# Patient Record
Sex: Male | Born: 1990 | Race: White | Hispanic: No | Marital: Single | Smoking: Former smoker
Health system: Southern US, Community
[De-identification: ages and names within clinical notes are randomized; demographics above are authoritative.]

## PROBLEM LIST (undated history)

## (undated) DIAGNOSIS — F329 Major depressive disorder, single episode, unspecified: Secondary | ICD-10-CM

## (undated) DIAGNOSIS — F32A Depression, unspecified: Secondary | ICD-10-CM

## (undated) DIAGNOSIS — T7840XA Allergy, unspecified, initial encounter: Secondary | ICD-10-CM

## (undated) HISTORY — DX: Depression, unspecified: F32.A

## (undated) HISTORY — DX: Major depressive disorder, single episode, unspecified: F32.9

## (undated) HISTORY — DX: Allergy, unspecified, initial encounter: T78.40XA

## (undated) HISTORY — PX: FRACTURE SURGERY: SHX138

---

## 2014-02-10 ENCOUNTER — Emergency Department (HOSPITAL_COMMUNITY): Payer: BC Managed Care – PPO

## 2014-02-10 ENCOUNTER — Emergency Department (HOSPITAL_COMMUNITY)
Admission: EM | Admit: 2014-02-10 | Discharge: 2014-02-10 | Disposition: A | Discharge status: Home / Self Care | Payer: BC Managed Care – PPO | Attending: Emergency Medicine | Admitting: Emergency Medicine

## 2014-02-10 ENCOUNTER — Encounter (HOSPITAL_COMMUNITY): Payer: Self-pay | Admitting: Emergency Medicine

## 2014-02-10 DIAGNOSIS — Y929 Unspecified place or not applicable: Secondary | ICD-10-CM | POA: Insufficient documentation

## 2014-02-10 DIAGNOSIS — F17200 Nicotine dependence, unspecified, uncomplicated: Secondary | ICD-10-CM | POA: Insufficient documentation

## 2014-02-10 DIAGNOSIS — Y9389 Activity, other specified: Secondary | ICD-10-CM | POA: Insufficient documentation

## 2014-02-10 DIAGNOSIS — S62308A Unspecified fracture of other metacarpal bone, initial encounter for closed fracture: Secondary | ICD-10-CM

## 2014-02-10 DIAGNOSIS — Z79899 Other long term (current) drug therapy: Secondary | ICD-10-CM | POA: Insufficient documentation

## 2014-02-10 DIAGNOSIS — R609 Edema, unspecified: Secondary | ICD-10-CM | POA: Insufficient documentation

## 2014-02-10 DIAGNOSIS — W2209XA Striking against other stationary object, initial encounter: Secondary | ICD-10-CM | POA: Insufficient documentation

## 2014-02-10 DIAGNOSIS — S62309A Unspecified fracture of unspecified metacarpal bone, initial encounter for closed fracture: Secondary | ICD-10-CM | POA: Insufficient documentation

## 2014-02-10 MED ORDER — OXYCODONE-ACETAMINOPHEN 5-325 MG PO TABS
2.0000 | ORAL_TABLET | Freq: Once | ORAL | Status: AC
  Administered 2014-02-10: 2 via ORAL
  Filled 2014-02-10: qty 2

## 2014-02-10 MED ORDER — TETANUS-DIPHTH-ACELL PERTUSSIS 5-2.5-18.5 LF-MCG/0.5 IM SUSP
0.5000 mL | Freq: Once | INTRAMUSCULAR | Status: AC
  Administered 2014-02-10: 0.5 mL via INTRAMUSCULAR
  Filled 2014-02-10: qty 0.5

## 2014-02-10 MED ORDER — OXYCODONE-ACETAMINOPHEN 5-325 MG PO TABS: 1.0000 | ORAL_TABLET | ORAL | Status: DC | PRN

## 2014-02-10 NOTE — ED Provider Notes (Signed)
CSN: [id number]     Arrival date & time 02/10/14  0004 History   First MD Initiated Contact with Patient 02/10/14 0127     Chief Complaint  Patient presents with  . Hand Injury     (Consider location/radiation/quality/duration/timing/severity/associated sxs/prior Treatment) The history is provided by the patient and medical records. No language interpreter was used.    Calvin MascotRonald Nelson is a 23 y.o. male  with a hx of depression, ADHD presents to the Emergency Department complaining of acute, persistent, progressively worsening right hand pain onset 11:45PM after punching a wall.  Associated symptoms include swelling and pain to the dorsum of the right hand.  Nothing makes it better and movement and palpation makes it worse.  Pt denies fever, chills, neck pain, shoulder pain, elbow pain.  Pt endorses mild right wrist pain as well.  He reports large amounts of EtOH prior to punching the drywall.  Marland Kitchen.     History reviewed. No pertinent past medical history. History reviewed. No pertinent past surgical history. No family history on file. History  Substance Use Topics  . Smoking status: Current Every Day Smoker  . Smokeless tobacco: Current User  . Alcohol Use: Yes    Review of Systems  Constitutional: Negative for fever and chills.  Gastrointestinal: Negative for nausea and vomiting.  Musculoskeletal: Positive for arthralgias and joint swelling. Negative for back pain, neck pain and neck stiffness.  Skin: Negative for wound.  Neurological: Negative for numbness.  Hematological: Does not bruise/bleed easily.  Psychiatric/Behavioral: The patient is not nervous/anxious.   All other systems reviewed and are negative.      Allergies  Other  Home Medications   Current Outpatient Rx  Name  Route  Sig  Dispense  Refill  . amphetamine-dextroamphetamine (ADDERALL) 20 MG tablet   Oral   Take 20 mg by mouth daily.         Marland Kitchen. oxyCODONE-acetaminophen (PERCOCET/ROXICET) 5-325 MG per  tablet   Oral   Take 1-2 tablets by mouth every 4 (four) hours as needed for severe pain.   20 tablet   0   . sertraline (ZOLOFT) 50 MG tablet   Oral   Take 50 mg by mouth daily.          BP 160/99  Pulse 98  Temp(Src) 97.8 F (36.6 C) (Oral)  Resp 18  Ht 6' (1.829 m)  Wt 230 lb (104.327 kg)  BMI 31.19 kg/m2  SpO2 96% Physical Exam  Nursing note and vitals reviewed. Constitutional: He appears well-developed and well-nourished. No distress.  HENT:  Head: Normocephalic and atraumatic.  Eyes: Conjunctivae are normal.  Neck: Normal range of motion.  Cardiovascular: Normal rate, regular rhythm, normal heart sounds and intact distal pulses.   No murmur heard. Capillary refill less than 3 seconds  Pulmonary/Chest: Effort normal and breath sounds normal.  Musculoskeletal: He exhibits tenderness. He exhibits no edema.  ROM: Range of motion in the right pinky and right wrist secondary to pain Large hematoma overlying the dorsum of the right hand pain to palpation in this area No visible or palpable deformity of the right wrist  Neurological: He is alert. Coordination normal.  Sensation intact to dull and sharp including 2. discrimination Strength 5 out of 5 resisted flexion and extension of the first 4 digits of the right hand, 3/5 resisted flexion and extension of the right pinky and wrist secondary to pain; 5 out of 5 strength in all other joints of the right upper extremity  Skin:  Skin is warm and dry. He is not diaphoretic.  No tenting of the skin  Psychiatric: He has a normal mood and affect.    ED Course  Reduction of fracture Date/Time: 02/10/2014 2:47 AM Performed by: Dierdre Forth Authorized by: Dierdre Forth Consent: Verbal consent obtained. Risks and benefits: risks, benefits and alternatives were discussed Consent given by: patient Patient understanding: patient states understanding of the procedure being performed Patient consent: the patient's  understanding of the procedure matches consent given Procedure consent: procedure consent matches procedure scheduled Relevant documents: relevant documents present and verified Site marked: the operative site was marked Imaging studies: imaging studies available Required items: required blood products, implants, devices, and special equipment available Patient identity confirmed: verbally with patient and arm band Time out: Immediately prior to procedure a "time out" was called to verify the correct patient, procedure, equipment, support staff and site/side marked as required. Preparation: Patient was prepped and draped in the usual sterile fashion. Local anesthesia used: yes Anesthesia: hematoma block Local anesthetic: lidocaine 2% with epinephrine Anesthetic total: 10 ml Patient sedated: no Patient tolerance: Patient tolerated the procedure well with no immediate complications.   (including critical care time) Labs Review Labs Reviewed - No data to display Imaging Review Dg Hand 2 View Right  02/10/2014   CLINICAL DATA:  Postreduction fourth and fifth metacarpals.  EXAM: RIGHT HAND - 2 VIEW  COMPARISON:  Radiograph from the same day at 1:07 a.m.  FINDINGS: There has been splinting of the hand. There is persistent 100% posterior displacement of transverse fractures through the fourth and fifth metacarpal shafts. No interval joint malalignment. Distal forearm alignment stable from prior.  IMPRESSION: Fourth and fifth metacarpal shaft fractures remain posteriorly displaced.   Electronically Signed   By: Tiburcio Pea M.D.   On: 02/10/2014 03:51   Dg Hand Complete Right  02/10/2014   CLINICAL DATA:  Hand injury.  Punched wall.  EXAM: RIGHT HAND - COMPLETE 3+ VIEW  COMPARISON:  None.  FINDINGS: Transverse fractures through the fourth and fifth metacarpal shafts. Both are posteriorly displaced. The fourth metacarpal fracture is the most significantly displaced, over 100%. There is angulation of  the distal ulna ventrally. Small ossicle at the ulnar styloid process may represent remote injury.  IMPRESSION: 1. Displaced fourth and fifth metacarpal shaft fractures. 2. Angulation of the distal ulnar shaft, correlate forearm trauma.   Electronically Signed   By: Tiburcio Pea M.D.   On: 02/10/2014 02:46     EKG Interpretation None      MDM   Final diagnoses:  Closed fracture of 4th metacarpal  Closed fracture of 5th metacarpal    Calvin Nelson presents after punching a wall with right hand pain.  He has limited ROM of the wrist 2/2 pain, but reports that he was moving it freely before the swelling became severe.   X-ray with displaced fracture of the right 4th and 5th metacarpals.  Hematoma block given and fracture reduced.  Will obtain  postreduction films and continued pain control.     4:00 AM Pt splinted and post reduction films with minimal improvement.  Pt with adequate pain control.  Will d/c home for close follow-up with hand surgery.  Pt reports he may do this when he returns home this weekend.    Pt advised to follow up with orthopedics for further evaluation and treatment.  Patient given ulnar gutter while in ED, conservative therapy recommended and discussed. Patient will be dc home & is agreeable with  above plan. I have also discussed reasons to return immediately to the ER.  Patient expresses understanding and agrees with plan.  It has been determined that no acute conditions requiring further emergency intervention are present at this time. The patient/guardian have been advised of the diagnosis and plan. We have discussed signs and symptoms that warrant return to the ED, such as changes or worsening in symptoms.   Vital signs are stable at discharge.   BP 160/99  Pulse 98  Temp(Src) 97.8 F (36.6 C) (Oral)  Resp 18  Ht 6' (1.829 m)  Wt 230 lb (104.327 kg)  BMI 31.19 kg/m2  SpO2 96%  Patient/guardian has voiced understanding and agreed to follow-up with the  PCP or specialist.        Dierdre Forth, PA-C 02/10/14 0448

## 2014-02-10 NOTE — ED Provider Notes (Signed)
Medical screening examination/treatment/procedure(s) were performed by non-physician practitioner and as supervising physician I was immediately available for consultation/collaboration.    Daryle Amis M Tavoris Brisk, MD 02/10/14 0629 

## 2014-02-10 NOTE — ED Notes (Signed)
Pt states he punched a wall and now having R hand pain

## 2014-02-10 NOTE — Progress Notes (Signed)
Orthopedic Tech Progress Note Patient Details:  Calvin MascotRonald Nelson Jun 13, 1991 [id number]  Ortho Devices Type of Ortho Device: Ulna gutter splint   Haskell Flirtewsome, Aggie Douse M 02/10/2014, 2:08 AM

## 2014-02-10 NOTE — Discharge Instructions (Signed)
1. Medications: percocet, usual home medications 2. Treatment: rest, drink plenty of fluids, keep splint dry 3. Follow Up: Please followup with hand surgery   Boxer's Fracture You have a break (fracture) of the fifth metacarpal bone. This is commonly called a boxer's fracture. This is the bone in the hand where the little finger attaches. The fracture is in the end of that bone, closest to the little finger. It is usually caused when you hit an object with a clenched fist. Often, the knuckle is pushed down by the impact. Sometimes, the fracture rotates out of position. A boxer's fracture will usually heal within 6 weeks, if it is treated properly and protected from re-injury. Surgery is sometimes needed. A cast, splint, or bulky hand dressing may be used to protect and immobilize a boxer's fracture. Do not remove this device or dressing until your caregiver approves. Keep your hand elevated, and apply ice packs for 15-20 minutes every 2 hours, for the first 2 days. Elevation and ice help reduce swelling and relieve pain. See your caregiver, or an orthopedic specialist, for follow-up care within the next 10 days. This is to make sure your fracture is healing properly. Document Released: 11/24/2005 Document Revised: 02/16/2012 Document Reviewed: 05/14/2007 [facility] Patient Information 2014 [place]Care, [place].

## 2015-10-10 IMAGING — CR DG HAND 2V*R*
2 series · 2 of 2 positions shown · non-contrast
Comparison: Radiograph from the same day at [DATE] a.m.

CLINICAL DATA: Postreduction fourth and fifth metacarpals.

EXAM:
RIGHT HAND - 2 VIEW

[x hand pa right]
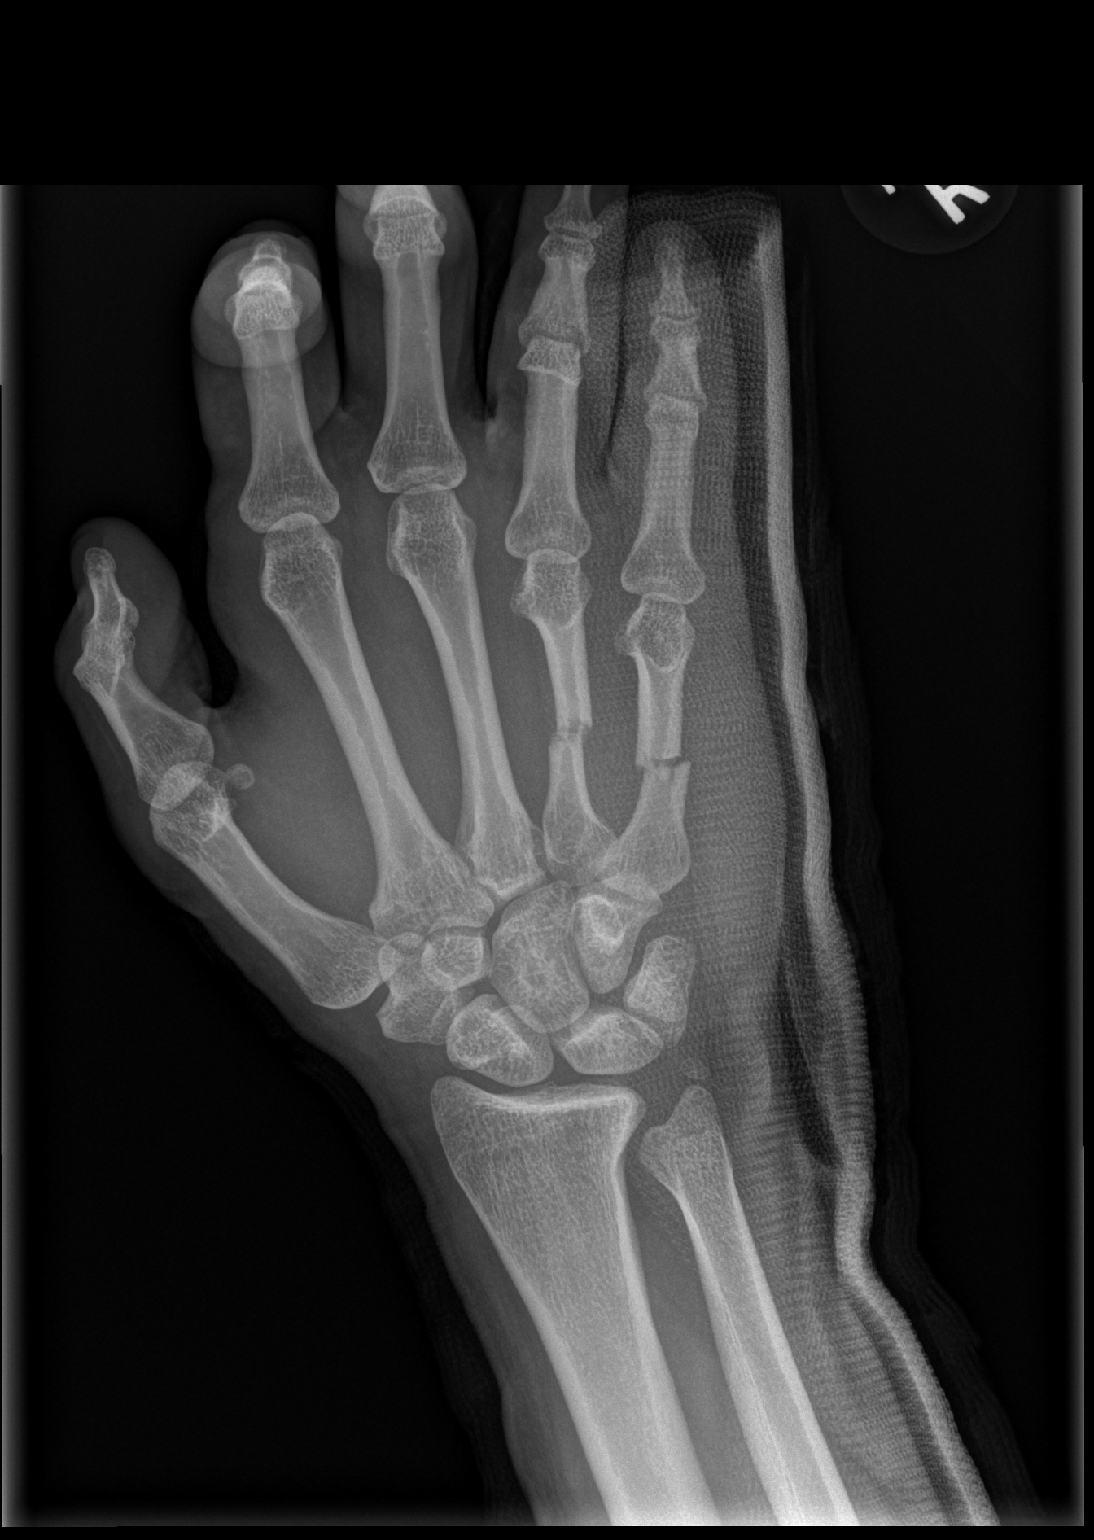

[x hand lat right]
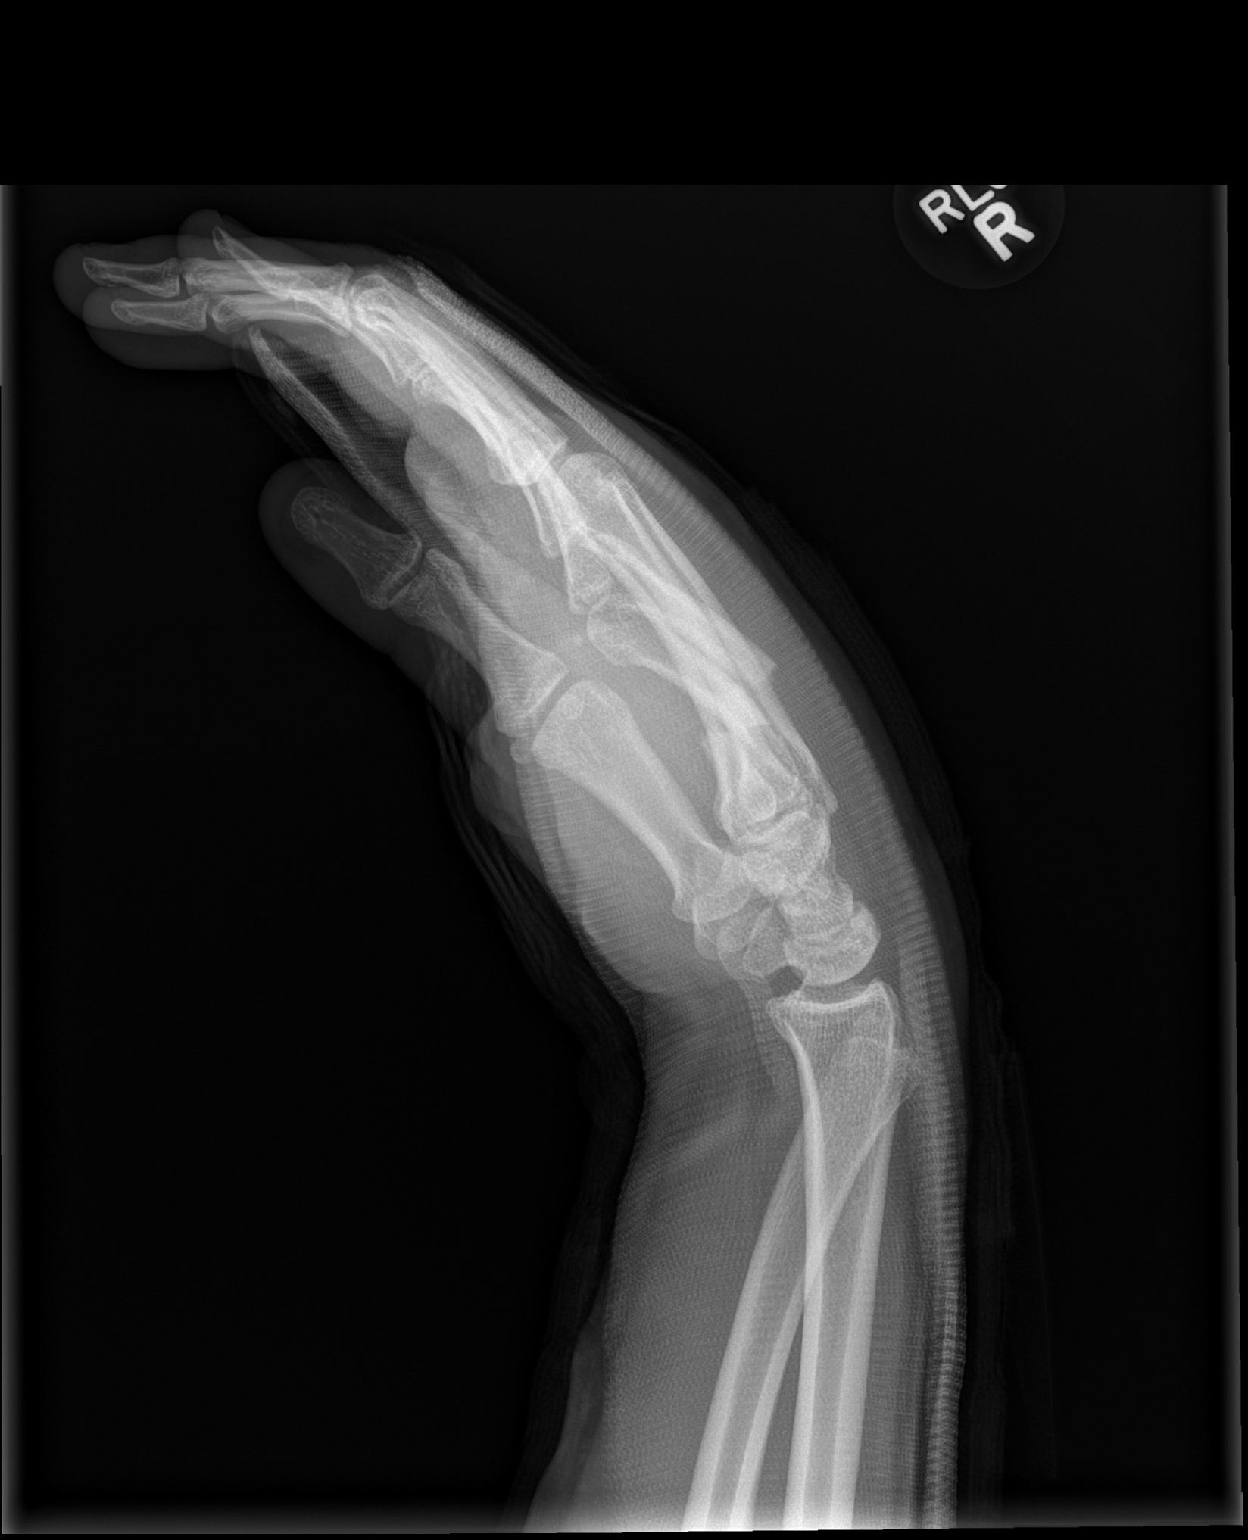

[2 of 2 positions shown; findings below may reference images not displayed]

FINDINGS: There has been splinting of the hand. There is persistent 100%
posterior displacement of transverse fractures through the fourth
and fifth metacarpal shafts. No interval joint malalignment. Distal
forearm alignment stable from prior.
IMPRESSION: Fourth and fifth metacarpal shaft fractures remain posteriorly
displaced.

## 2016-09-20 ENCOUNTER — Ambulatory Visit (INDEPENDENT_AMBULATORY_CARE_PROVIDER_SITE_OTHER): Payer: BLUE CROSS/BLUE SHIELD | Admitting: Family Medicine

## 2016-09-20 VITALS — BP 146/100 | HR 77 | Temp 98.2°F | Resp 17 | Ht 72.0 in | Wt 304.0 lb

## 2016-09-20 DIAGNOSIS — S00412A Abrasion of left ear, initial encounter: Secondary | ICD-10-CM

## 2016-09-20 DIAGNOSIS — Z7289 Other problems related to lifestyle: Secondary | ICD-10-CM

## 2016-09-20 DIAGNOSIS — H6692 Otitis media, unspecified, left ear: Secondary | ICD-10-CM | POA: Diagnosis not present

## 2016-09-20 DIAGNOSIS — Z789 Other specified health status: Secondary | ICD-10-CM | POA: Diagnosis not present

## 2016-09-20 DIAGNOSIS — R030 Elevated blood-pressure reading, without diagnosis of hypertension: Secondary | ICD-10-CM

## 2016-09-20 DIAGNOSIS — H7292 Unspecified perforation of tympanic membrane, left ear: Secondary | ICD-10-CM

## 2016-09-20 MED ORDER — OFLOXACIN 0.3 % OT SOLN: 10.0000 [drp] | Freq: Every day | OTIC | 0 refills | Status: AC

## 2016-09-20 MED ORDER — AMOXICILLIN 875 MG PO TABS: 875.0000 mg | ORAL_TABLET | Freq: Two times a day (BID) | ORAL | 0 refills | Status: AC

## 2016-09-20 NOTE — Progress Notes (Signed)
By signing my name below I, Shelah LewandowskyJoseph Thomas, attest that this documentation has been prepared under the direction and in the presence of Shade FloodJeffrey R Levenia Skalicky, MD. Electonically Signed. Shelah LewandowskyJoseph Thomas, Scribe 09/20/2016 at 11:57 AM  Subjective:    Patient ID: Calvin Nelson, male    DOB: 01/12/1991, 25 y.o.   MRN: [id number]  Chief Complaint  Patient presents with  . Ear Pain    And bleeding. Left. Started this AM.    HPI Calvin Nelson is a 25 y.o. male who presents to the [facility] complaining of left ear pain and bleeding that started this morning. Pt has had a malodorous discharge from left ear for the past several months. Discharge has been mostly brownish yellow clumps. Pt uses Q-tips everyday. Pt also reports reduced hearing in her left ear for the past several months. Pt had ear tubes placed multiple times in his past. Last time tubes were placed was while pt was in high school.   Pt c/o mild dry cough for "awhile".   Pt also c/o having intermittent generalized HAs for the past month as well.   Pt denies any fever, unexplained thirst, diaphoresis, or night sweats.   Pt admits to drinking alcohol daily. Pt drinks 4 beers a day on average.  There are no active problems to display for this patient.  Past Medical History:  Diagnosis Date  . Allergy   . Depression    Past Surgical History:  Procedure Laterality Date  . FRACTURE SURGERY     Hand   Allergies  Allergen Reactions  . Other Other (See Comments)    Raw fruit & vegetables : can eat them if they are cooked Pollen: seasonal allergies   Prior to Admission medications   Medication Sig Start Date End Date Taking? Authorizing Provider  amphetamine-dextroamphetamine (ADDERALL) 20 MG tablet Take 20 mg by mouth daily.   Yes Historical Provider, MD  sertraline (ZOLOFT) 50 MG tablet Take 50 mg by mouth daily.   Yes Historical Provider, MD  oxyCODONE-acetaminophen (PERCOCET/ROXICET) 5-325 MG per tablet Take  1-2 tablets by mouth every 4 (four) hours as needed for severe pain. Patient not taking: Reported on 09/20/2016 02/10/14   Dierdre ForthHannah Muthersbaugh, PA-C   Social History   Social History  . Marital status: Single    Spouse name: N/A  . Number of children: N/A  . Years of education: N/A   Occupational History  . Not on file.   Social History Main Topics  . Smoking status: Former Games developermoker  . Smokeless tobacco: Current User  . Alcohol use Yes  . Drug use: No  . Sexual activity: Not on file   Other Topics Concern  . Not on file   Social History Narrative  . No narrative on file      Review of Systems  Constitutional: Negative for diaphoresis and fever.  HENT: Positive for ear discharge (left) and ear pain (left).   Endocrine: Negative for polydipsia.  Neurological: Positive for headaches.       Objective:   Physical Exam  Constitutional: He is oriented to person, place, and time. He appears well-developed and well-nourished. No distress.  HENT:  Head: Normocephalic and atraumatic.  Rt ear canal has irritation along canal and scarring on the TM. No rt canal edema.  Left mastoid is nontender. There is dried blood along the left ear canal with abrasions and irritation. Unable to visualize left TM due to yellow exudate mixed with blood.  Eyes:  Conjunctivae and EOM are normal. Pupils are equal, round, and reactive to light.  Neck: Neck supple.  Cardiovascular: Normal rate, regular rhythm and normal heart sounds.  Exam reveals no gallop and no friction rub.   No murmur heard. Pulmonary/Chest: Effort normal and breath sounds normal. No accessory muscle usage. He has no decreased breath sounds. He has no wheezes. He has no rhonchi. He has no rales.  Musculoskeletal: Normal range of motion.  No lower extremity edema.   Neurological: He is alert and oriented to person, place, and time.  Nonfocal neuro exam.   Skin: Skin is warm and dry.  Psychiatric: He has a normal mood and affect.  His behavior is normal.  Nursing note and vitals reviewed.   Vitals:   09/20/16 1133  BP: (!) 140/108  Pulse: 77  Resp: 17  Temp: 98.2 F (36.8 C)  TempSrc: Oral  SpO2: 96%  Weight: (!) 304 lb (137.9 kg)  Height: 6' (1.829 m)         Assessment & Plan:    Calvin Nelson is a 25 y.o. male Left otitis media with spontaneous rupture of eardrum - Plan: amoxicillin (AMOXIL) 875 MG tablet, ofloxacin (FLOXIN OTIC) 0.3 % otic solution Abrasion of left ear canal, initial encounter - Plan: ofloxacin (FLOXIN OTIC) 0.3 % otic solution  -With intermittent discharge and previous muffled hearing, possible otitis media with rupture and small amount of blood due to rupture versus abrasion of ear canal with the use of cotton tipped swabs. Did not see an appreciable amount of canal edema, but otitis externa also possible.   -Start Floxin Otic, amoxicillin to cover for both otitis media with rupture and possible early externa.   -Recheck in 3 days at that time may need ENT evaluation. Advised to avoid any further cotton tip swab usage inside the ear.  Elevated blood pressure reading Alcohol use  - alcohol abuse/overuse. May be contributor to his elevated blood pressure. Advised to cut back to no more than 1-2 drinks per day, and recheck blood pressure at next visit. ER/ RTC precautions given.  Meds ordered this encounter  Medications  . amoxicillin (AMOXIL) 875 MG tablet    Sig: Take 1 tablet (875 mg total) by mouth 2 (two) times daily.    Dispense:  20 tablet    Refill:  0  . ofloxacin (FLOXIN OTIC) 0.3 % otic solution    Sig: Place 10 drops into the left ear daily.    Dispense:  10 mL    Refill:  0   Patient Instructions   Cut back on beer as this may be elevating your blood pressure and may also be leading to headaches. We can discuss your headaches further at next visit, and also repeat your blood pressure measurement at that time. If it still remains elevated, may need to start a new  blood pressure medication. Keep a record of your blood pressures outside of the office and bring them to the next office visit.  Return to the clinic or go to the nearest emergency room if any of your symptoms worsen or new symptoms occur.  Your left ear today appears to have a possible middle ear infection that has ruptured causing the bleeding or some infection that is in the canal with an abrasion from the Q-tip. Avoid further use of Q-tips inside the ear, start the oral antibiotic as well as the drops, and recheck in 3 days as planned. At that time we can determine if you  need to see ear nose and throat specialist.   Return to the clinic or go to the nearest emergency room if any of your symptoms worsen or new symptoms occur.   Earache An earache, also called otalgia, can be caused by many things. Pain from an earache can be sharp, dull, or burning. The pain may be temporary or constant. Earaches can be caused by problems with the ear, such as infection in either the middle ear or the ear canal, injury, impacted ear wax, middle ear pressure, or a foreign body in the ear. Ear pain can also result from problems in other areas. This is called referred pain. For example, pain can come from a sore throat, a tooth infection, or problems with the jaw or the joint between the jaw and the skull (temporomandibular joint, or TMJ). The cause of an earache is not always easy to identify. Watchful waiting may be appropriate for some earaches until a clear cause of the pain can be found. HOME CARE INSTRUCTIONS Watch your condition for any changes. The following actions may help to lessen any discomfort that you are feeling:  Take medicines only as directed by your health care provider. This includes ear drops.  Apply ice to your outer ear to help reduce pain.  Put ice in a plastic bag.  Place a towel between your skin and the bag.  Leave the ice on for 20 minutes, 2-3 times per day.  Do not put  anything in your ear other than medicine that is prescribed by your health care provider.  Try resting in an upright position instead of lying down. This may help to reduce pressure in the middle ear and relieve pain.  Chew gum if it helps to relieve your ear pain.  Control any allergies that you have.  Keep all follow-up visits as directed by your health care provider. This is important. SEEK MEDICAL CARE IF:  Your pain does not improve within 2 days.  You have a fever.  You have new or worsening symptoms. SEEK IMMEDIATE MEDICAL CARE IF:  You have a severe headache.  You have a stiff neck.  You have difficulty swallowing.  You have redness or swelling behind your ear.  You have drainage from your ear.  You have hearing loss.  You feel dizzy.   This information is not intended to replace advice given to you by your health care provider. Make sure you discuss any questions you have with your health care provider.   Document Released: 07/11/2004 Document Revised: 12/15/2014 Document Reviewed: 06/25/2014 Elsevier Interactive Patient Education 2016 [employer].   IF you received an x-ray today, you will receive an invoice from [facility]. Please contact [facility] at [phone] with questions or concerns regarding your invoice.   IF you received labwork today, you will receive an invoice from [employer]. Please contact [facility] at [phone] with questions or concerns regarding your invoice.   Our billing staff will not be able to assist you with questions regarding bills from these companies.  You will be contacted with the lab results as soon as they are available. The fastest way to get your results is to activate your My Chart account. Instructions are located on the last page of this paperwork. If you have not heard from [place] regarding the results in 2 weeks, please contact this office.

## 2016-09-20 NOTE — Patient Instructions (Addendum)
Cut back on beer as this may be elevating your blood pressure and may also be leading to headaches. We can discuss your headaches further at next visit, and also repeat your blood pressure measurement at that time. If it still remains elevated, may need to start a new blood pressure medication. Keep a record of your blood pressures outside of the office and bring them to the next office visit.  Return to the clinic or go to the nearest emergency room if any of your symptoms worsen or new symptoms occur.  Your left ear today appears to have a possible middle ear infection that has ruptured causing the bleeding or some infection that is in the canal with an abrasion from the Q-tip. Avoid further use of Q-tips inside the ear, start the oral antibiotic as well as the drops, and recheck in 3 days as planned. At that time we can determine if you need to see ear nose and throat specialist.   Return to the clinic or go to the nearest emergency room if any of your symptoms worsen or new symptoms occur.   Earache An earache, also called otalgia, can be caused by many things. Pain from an earache can be sharp, dull, or burning. The pain may be temporary or constant. Earaches can be caused by problems with the ear, such as infection in either the middle ear or the ear canal, injury, impacted ear wax, middle ear pressure, or a foreign body in the ear. Ear pain can also result from problems in other areas. This is called referred pain. For example, pain can come from a sore throat, a tooth infection, or problems with the jaw or the joint between the jaw and the skull (temporomandibular joint, or TMJ). The cause of an earache is not always easy to identify. Watchful waiting may be appropriate for some earaches until a clear cause of the pain can be found. HOME CARE INSTRUCTIONS Watch your condition for any changes. The following actions may help to lessen any discomfort that you are feeling:  Take medicines only as  directed by your health care provider. This includes ear drops.  Apply ice to your outer ear to help reduce pain.  Put ice in a plastic bag.  Place a towel between your skin and the bag.  Leave the ice on for 20 minutes, 2-3 times per day.  Do not put anything in your ear other than medicine that is prescribed by your health care provider.  Try resting in an upright position instead of lying down. This may help to reduce pressure in the middle ear and relieve pain.  Chew gum if it helps to relieve your ear pain.  Control any allergies that you have.  Keep all follow-up visits as directed by your health care provider. This is important. SEEK MEDICAL CARE IF:  Your pain does not improve within 2 days.  You have a fever.  You have new or worsening symptoms. SEEK IMMEDIATE MEDICAL CARE IF:  You have a severe headache.  You have a stiff neck.  You have difficulty swallowing.  You have redness or swelling behind your ear.  You have drainage from your ear.  You have hearing loss.  You feel dizzy.   This information is not intended to replace advice given to you by your health care provider. Make sure you discuss any questions you have with your health care provider.   Document Released: 07/11/2004 Document Revised: 12/15/2014 Document Reviewed: 06/25/2014 Elsevier Interactive Patient  Education 2016 [employer].   IF you received an x-ray today, you will receive an invoice from [facility]. Please contact [facility] at [phone] with questions or concerns regarding your invoice.   IF you received labwork today, you will receive an invoice from [employer]. Please contact [facility] at [phone] with questions or concerns regarding your invoice.   Our billing staff will not be able to assist you with questions regarding bills from these companies.  You will be contacted with the lab results as soon as they are available.  The fastest way to get your results is to activate your My Chart account. Instructions are located on the last page of this paperwork. If you have not heard from [place] regarding the results in 2 weeks, please contact this office.

## 2016-09-23 ENCOUNTER — Ambulatory Visit (INDEPENDENT_AMBULATORY_CARE_PROVIDER_SITE_OTHER): Payer: BLUE CROSS/BLUE SHIELD | Admitting: Family Medicine

## 2016-09-23 VITALS — BP 138/84 | HR 80 | Temp 98.5°F | Resp 16 | Wt 303.2 lb

## 2016-09-23 DIAGNOSIS — H9192 Unspecified hearing loss, left ear: Secondary | ICD-10-CM | POA: Diagnosis not present

## 2016-09-23 DIAGNOSIS — R030 Elevated blood-pressure reading, without diagnosis of hypertension: Secondary | ICD-10-CM

## 2016-09-23 DIAGNOSIS — H66012 Acute suppurative otitis media with spontaneous rupture of ear drum, left ear: Secondary | ICD-10-CM | POA: Diagnosis not present

## 2016-09-23 NOTE — Progress Notes (Signed)
By signing my name below, I, Mesha Guinyard, attest that this documentation has been prepared under the direction and in the presence of Meredith Staggers, MD.  Electronically Signed: Arvilla Market, Medical Scribe. 09/23/16. 5:47 PM.  Subjective:    Patient ID: Calvin Nelson, male    DOB: [birth year], 25 y.o.   MRN: [id number]  HPI Chief Complaint  Patient presents with  . Follow-up    left ear recheck, no new complaint.no bleeding.    HPI Comments: Calvin Nelson is a 25 y.o. male who presents to the [facility] for left ear follow-up. Pt was last seen 3 day ago for suspected otitis media with spontaneous rupture of ear drum vs component of otitis externa and abrasion from use of cotton tip swab. Pt mentions he felt like it was blocked months before he came in 3 days ago. There was some difficulty visualizing his TM due to exudate/fluid in canal at last visit. Started on floxin and amoxicillin to cover both conditions.  Pt still feels some blockage in his ears and is compliant with floxin, and amoxicillin. Pt denies HA, tinnitus, fevers, chills, ear pain and ear bleeding.  HTN: See last visit. He was advised to dec acohol/beer intake. Pts last beer was 3 days ago.  There are no active problems to display for this patient.  Past Medical History:  Diagnosis Date  . Allergy   . Depression    Past Surgical History:  Procedure Laterality Date  . FRACTURE SURGERY     Hand   Allergies  Allergen Reactions  . Other Other (See Comments)    Raw fruit & vegetables : can eat them if they are cooked Pollen: seasonal allergies   Prior to Admission medications   Medication Sig Start Date End Date Taking? Authorizing Provider  amoxicillin (AMOXIL) 875 MG tablet Take 1 tablet (875 mg total) by mouth 2 (two) times daily. 09/20/16  Yes Shade Flood, MD  amphetamine-dextroamphetamine (ADDERALL) 20 MG tablet Take 20 mg by mouth daily.   Yes Historical Provider, MD    ofloxacin (FLOXIN OTIC) 0.3 % otic solution Place 10 drops into the left ear daily. 09/20/16  Yes Shade Flood, MD  sertraline (ZOLOFT) 50 MG tablet Take 50 mg by mouth daily.   Yes Historical Provider, MD   Social History   Social History  . Marital status: Single    Spouse name: N/A  . Number of children: N/A  . Years of education: N/A   Occupational History  . Not on file.   Social History Main Topics  . Smoking status: Former Games developer  . Smokeless tobacco: Former Neurosurgeon  . Alcohol use Yes  . Drug use: No  . Sexual activity: Not on file   Other Topics Concern  . Not on file   Social History Narrative  . No narrative on file   Review of Systems  Constitutional: Negative for fatigue and unexpected weight change.  HENT: Negative for ear discharge, ear pain and tinnitus.   Eyes: Negative for visual disturbance.  Respiratory: Negative for cough, chest tightness and shortness of breath.   Cardiovascular: Negative for chest pain, palpitations and leg swelling.  Gastrointestinal: Negative for abdominal pain and blood in stool.  Neurological: Negative for dizziness, light-headedness and headaches.   Objective:  Physical Exam  Constitutional: He is oriented to person, place, and time. He appears well-developed and well-nourished.  HENT:  Head: Normocephalic and atraumatic.  Right Ear: External ear and ear canal  normal.  Left Ear: Tympanic membrane, external ear and ear canal normal. No mastoid tenderness.  Nose: No rhinorrhea.  Mouth/Throat: Oropharynx is clear and moist and mucous membranes are normal. No oropharyngeal exudate or posterior oropharyngeal erythema.  Right Ear: TM with some scaring but the remainder appears nl Left Ear: External ear non tender Exudate along walls of canal which appears to be edematous with minimal dried blood near the TM Exudate against the visualized aspect of the TM  Eyes: Conjunctivae are normal. Pupils are equal, round, and reactive to  light.  Neck: Neck supple.  Cardiovascular: Normal rate, regular rhythm, normal heart sounds and intact distal pulses.   No murmur heard. Pulmonary/Chest: Effort normal and breath sounds normal. He has no wheezes. He has no rhonchi. He has no rales.  Abdominal: Soft. There is no tenderness.  Lymphadenopathy:    He has no cervical adenopathy.  Neurological: He is alert and oriented to person, place, and time.  Skin: Skin is warm and dry. No rash noted.  Psychiatric: He has a normal mood and affect. His behavior is normal.  Vitals reviewed.  BP 138/84 (BP Location: Left Arm, Cuff Size: Large)   Pulse 80   Temp 98.5 F (36.9 C) (Oral)   Resp 16   Wt (!) 303 lb 3.2 oz (137.5 kg)   SpO2 97%   BMI 41.12 kg/m  Assessment & Plan:  Calvin Nelson is a 25 y.o. male Acute suppurative otitis media of left ear with spontaneous rupture of tympanic membrane, recurrence not specified - Plan: Ambulatory referral to ENT, Hearing loss of left ear, unspecified hearing loss type - Plan: Ambulatory referral to ENT  - Reportedly had decreased hearing in the left side for months prior to last visit, but now with suspected otitis media with secondary rupture versus traumatic rupture with use of cotton tipped swabs and secondary infection. Still with persistent hearing loss, still some difficulty visualizing TM. No vestibular symptoms, no mastoid tenderness.  -continue Floxin Otic, amoxicillin by mouth, and refer to ENT this week   Elevated blood pressure reading  - Improved today. Continue to avoid alcohol, or at least no more than 1-2 drinks per day.   No orders of the defined types were placed in this encounter.  Patient Instructions   Although your ear does not look worse, I'm still concerned for a possible eardrum rupture, and with the continued difficulty hearing would like you to see specialist.  Continue the drops that were prescribed last visit, antibiotic by mouth and follow-up with ear nose and  throat specialist. I will refer you to someone locally in next few days likely.   Return to the clinic or go to the nearest emergency room if any of your symptoms worsen or new symptoms occur.  Blood pressure is better today - likely due to cutting back on beer.  Continue to avoid beer as much as possible, and no more than 1-2 per day. Keep a record of your blood pressures outside of the office and if they are running over 140/90 - return to discuss possible medication.    IF you received an x-ray today, you will receive an invoice from [facility]. Please contact [facility] at [phone] with questions or concerns regarding your invoice.   IF you received labwork today, you will receive an invoice from [employer]. Please contact [facility] at [phone] with questions or concerns regarding your invoice.   Our billing staff will not be able to assist  you with questions regarding bills from these companies.  You will be contacted with the lab results as soon as they are available. The fastest way to get your results is to activate your My Chart account. Instructions are located on the last page of this paperwork. If you have not heard from [place] regarding the results in 2 weeks, please contact this office.        I personally performed the services described in this documentation, which was scribed in my presence. The recorded information has been reviewed and considered, and addended by me as needed.   Signed,   Meredith Staggers, MD [facility].  09/23/16 6:10 PM

## 2016-09-23 NOTE — Patient Instructions (Addendum)
Although your ear does not look worse, I'm still concerned for a possible eardrum rupture, and with the continued difficulty hearing would like you to see specialist.  Continue the drops that were prescribed last visit, antibiotic by mouth and follow-up with ear nose and throat specialist. I will refer you to someone locally in next few days likely.   Return to the clinic or go to the nearest emergency room if any of your symptoms worsen or new symptoms occur.  Blood pressure is better today - likely due to cutting back on beer.  Continue to avoid beer as much as possible, and no more than 1-2 per day. Keep a record of your blood pressures outside of the office and if they are running over 140/90 - return to discuss possible medication.    IF you received an x-ray today, you will receive an invoice from [facility]. Please contact [facility] at [phone] with questions or concerns regarding your invoice.   IF you received labwork today, you will receive an invoice from [facility]. Please contact [facility] at [phone] with questions or concerns regarding your invoice.   Our billing staff will not be able to assist you with questions regarding bills from these companies.  You will be contacted with the lab results as soon as they are available. The fastest way to get your results is to activate your My Chart account. Instructions are located on the last page of this paperwork. If you have not heard from us regarding the results in 2 weeks, please contact this office.
